# Patient Record
Sex: Female | Born: 1965 | Race: White | Hispanic: No | Marital: Single | Smoking: Current every day smoker
Health system: Southern US, Community
[De-identification: ages and names within clinical notes are randomized; demographics above are authoritative.]

## PROBLEM LIST (undated history)

## (undated) DIAGNOSIS — I10 Essential (primary) hypertension: Secondary | ICD-10-CM

## (undated) DIAGNOSIS — K432 Incisional hernia without obstruction or gangrene: Secondary | ICD-10-CM

## (undated) DIAGNOSIS — K219 Gastro-esophageal reflux disease without esophagitis: Secondary | ICD-10-CM

## (undated) DIAGNOSIS — N3010 Interstitial cystitis (chronic) without hematuria: Secondary | ICD-10-CM

## (undated) DIAGNOSIS — F329 Major depressive disorder, single episode, unspecified: Secondary | ICD-10-CM

## (undated) DIAGNOSIS — E559 Vitamin D deficiency, unspecified: Secondary | ICD-10-CM

## (undated) DIAGNOSIS — E782 Mixed hyperlipidemia: Secondary | ICD-10-CM

## (undated) HISTORY — DX: Incisional hernia without obstruction or gangrene: K43.2

## (undated) HISTORY — PX: VAGINAL HYSTERECTOMY: SHX2639

## (undated) HISTORY — PX: HERNIA REPAIR: SHX51

## (undated) HISTORY — PX: CHOLECYSTECTOMY: SHX55

## (undated) HISTORY — DX: Vitamin D deficiency, unspecified: E55.9

## (undated) HISTORY — DX: Gastro-esophageal reflux disease without esophagitis: K21.9

## (undated) HISTORY — DX: Major depressive disorder, single episode, unspecified: F32.9

## (undated) HISTORY — DX: Interstitial cystitis (chronic) without hematuria: N30.10

## (undated) HISTORY — PX: OTHER SURGICAL HISTORY: SHX169

## (undated) HISTORY — DX: Mixed hyperlipidemia: E78.2

## (undated) HISTORY — PX: TUBAL LIGATION: SHX77

## (undated) HISTORY — DX: Essential (primary) hypertension: I10

---

## 2006-03-18 ENCOUNTER — Ambulatory Visit: Payer: Self-pay | Admitting: General Surgery

## 2006-05-23 ENCOUNTER — Ambulatory Visit: Payer: Self-pay | Admitting: General Surgery

## 2006-08-17 ENCOUNTER — Ambulatory Visit: Payer: Self-pay | Admitting: Gastroenterology

## 2012-04-03 ENCOUNTER — Ambulatory Visit: Payer: Self-pay | Admitting: Family Medicine

## 2012-04-07 ENCOUNTER — Ambulatory Visit: Payer: Self-pay | Admitting: Family Medicine

## 2012-04-20 ENCOUNTER — Ambulatory Visit: Payer: Self-pay | Admitting: Urology

## 2012-04-20 ENCOUNTER — Emergency Department: Payer: Self-pay | Admitting: Emergency Medicine

## 2012-04-21 LAB — CBC
HCT: 41.2 % (ref 35.0–47.0)
HGB: 12.9 g/dL (ref 12.0–16.0)
MCH: 28.2 pg (ref 26.0–34.0)
MCHC: 31.2 g/dL — ABNORMAL LOW (ref 32.0–36.0)
MCV: 91 fL (ref 80–100)
Platelet: 420 10*3/uL (ref 150–440)
RBC: 4.55 10*6/uL (ref 3.80–5.20)
WBC: 19 10*3/uL — ABNORMAL HIGH (ref 3.6–11.0)

## 2012-04-21 LAB — COMPREHENSIVE METABOLIC PANEL
Albumin: 3.7 g/dL (ref 3.4–5.0)
Alkaline Phosphatase: 86 U/L (ref 50–136)
Bilirubin,Total: 0.6 mg/dL (ref 0.2–1.0)
Calcium, Total: 10.5 mg/dL — ABNORMAL HIGH (ref 8.5–10.1)
Co2: 20 mmol/L — ABNORMAL LOW (ref 21–32)
Creatinine: 0.84 mg/dL (ref 0.60–1.30)
EGFR (Non-African Amer.): >60
SGPT (ALT): 35 U/L (ref 12–78)
Sodium: 135 mmol/L — ABNORMAL LOW (ref 136–145)
Total Protein: 8.2 g/dL (ref 6.4–8.2)

## 2012-04-21 LAB — URINALYSIS, COMPLETE
Bilirubin,UR: NEGATIVE
Glucose,UR: NEGATIVE mg/dL (ref 0–75)
Ph: 6 (ref 4.5–8.0)
RBC,UR: 645 /HPF (ref 0–5)
Specific Gravity: 1.021 (ref 1.003–1.030)
Squamous Epithelial: 16

## 2012-10-10 HISTORY — PX: OTHER SURGICAL HISTORY: SHX169

## 2013-08-22 DIAGNOSIS — K432 Incisional hernia without obstruction or gangrene: Secondary | ICD-10-CM

## 2013-08-22 HISTORY — DX: Incisional hernia without obstruction or gangrene: K43.2

## 2013-10-30 DIAGNOSIS — N3010 Interstitial cystitis (chronic) without hematuria: Secondary | ICD-10-CM

## 2013-10-30 HISTORY — DX: Interstitial cystitis (chronic) without hematuria: N30.10

## 2013-11-07 HISTORY — PX: OTHER SURGICAL HISTORY: SHX169

## 2014-07-15 DIAGNOSIS — I10 Essential (primary) hypertension: Secondary | ICD-10-CM

## 2014-07-15 HISTORY — DX: Essential (primary) hypertension: I10

## 2014-08-22 DIAGNOSIS — E782 Mixed hyperlipidemia: Secondary | ICD-10-CM

## 2014-08-22 HISTORY — DX: Mixed hyperlipidemia: E78.2

## 2014-11-07 ENCOUNTER — Other Ambulatory Visit: Payer: Self-pay | Admitting: Family Medicine

## 2014-11-07 DIAGNOSIS — R140 Abdominal distension (gaseous): Secondary | ICD-10-CM

## 2014-11-07 DIAGNOSIS — N838 Other noninflammatory disorders of ovary, fallopian tube and broad ligament: Secondary | ICD-10-CM

## 2014-11-11 ENCOUNTER — Ambulatory Visit: Admission: RE | Admit: 2014-11-11 | Payer: Self-pay | Source: Ambulatory Visit

## 2014-11-11 ENCOUNTER — Ambulatory Visit: Payer: Self-pay

## 2014-11-20 ENCOUNTER — Ambulatory Visit
Admission: RE | Admit: 2014-11-20 | Discharge: 2014-11-20 | Disposition: A | Discharge status: Home / Self Care | Payer: 59 | Source: Ambulatory Visit | Attending: Family Medicine | Admitting: Family Medicine

## 2014-11-20 ENCOUNTER — Ambulatory Visit: Payer: 59

## 2014-11-20 DIAGNOSIS — N838 Other noninflammatory disorders of ovary, fallopian tube and broad ligament: Secondary | ICD-10-CM

## 2014-11-20 DIAGNOSIS — Z87442 Personal history of urinary calculi: Secondary | ICD-10-CM | POA: Diagnosis not present

## 2014-11-20 DIAGNOSIS — R140 Abdominal distension (gaseous): Secondary | ICD-10-CM | POA: Insufficient documentation

## 2014-11-20 DIAGNOSIS — Z9049 Acquired absence of other specified parts of digestive tract: Secondary | ICD-10-CM | POA: Insufficient documentation

## 2014-11-22 ENCOUNTER — Other Ambulatory Visit: Payer: Self-pay | Admitting: Family Medicine

## 2014-11-22 DIAGNOSIS — R932 Abnormal findings on diagnostic imaging of liver and biliary tract: Secondary | ICD-10-CM

## 2014-11-28 ENCOUNTER — Ambulatory Visit
Admission: RE | Admit: 2014-11-28 | Discharge: 2014-11-28 | Disposition: A | Discharge status: Home / Self Care | Payer: 59 | Source: Ambulatory Visit | Attending: Family Medicine | Admitting: Family Medicine

## 2014-11-28 DIAGNOSIS — R932 Abnormal findings on diagnostic imaging of liver and biliary tract: Secondary | ICD-10-CM

## 2014-11-28 DIAGNOSIS — R935 Abnormal findings on diagnostic imaging of other abdominal regions, including retroperitoneum: Secondary | ICD-10-CM | POA: Insufficient documentation

## 2014-11-28 DIAGNOSIS — K760 Fatty (change of) liver, not elsewhere classified: Secondary | ICD-10-CM | POA: Diagnosis not present

## 2014-11-28 DIAGNOSIS — R109 Unspecified abdominal pain: Secondary | ICD-10-CM | POA: Diagnosis present

## 2014-11-28 DIAGNOSIS — Z09 Encounter for follow-up examination after completed treatment for conditions other than malignant neoplasm: Secondary | ICD-10-CM | POA: Diagnosis not present

## 2014-11-28 MED ORDER — GADOBENATE DIMEGLUMINE 529 MG/ML IV SOLN
20.0000 mL | Freq: Once | INTRAVENOUS | Status: AC | PRN
  Administered 2014-11-28: 18 mL via INTRAVENOUS

## 2014-12-11 ENCOUNTER — Other Ambulatory Visit: Payer: Self-pay

## 2014-12-11 DIAGNOSIS — E559 Vitamin D deficiency, unspecified: Secondary | ICD-10-CM

## 2014-12-11 DIAGNOSIS — R609 Edema, unspecified: Secondary | ICD-10-CM | POA: Insufficient documentation

## 2014-12-11 DIAGNOSIS — F329 Major depressive disorder, single episode, unspecified: Secondary | ICD-10-CM | POA: Insufficient documentation

## 2014-12-11 DIAGNOSIS — F32A Depression, unspecified: Secondary | ICD-10-CM

## 2014-12-11 DIAGNOSIS — K219 Gastro-esophageal reflux disease without esophagitis: Secondary | ICD-10-CM

## 2014-12-11 HISTORY — DX: Depression, unspecified: F32.A

## 2014-12-11 HISTORY — DX: Gastro-esophageal reflux disease without esophagitis: K21.9

## 2014-12-11 HISTORY — DX: Vitamin D deficiency, unspecified: E55.9

## 2014-12-12 ENCOUNTER — Encounter: Payer: Self-pay | Admitting: Gastroenterology

## 2014-12-12 ENCOUNTER — Ambulatory Visit (INDEPENDENT_AMBULATORY_CARE_PROVIDER_SITE_OTHER): Payer: 59 | Admitting: Gastroenterology

## 2014-12-12 ENCOUNTER — Encounter (INDEPENDENT_AMBULATORY_CARE_PROVIDER_SITE_OTHER): Payer: Self-pay

## 2014-12-12 VITALS — BP 114/64 | HR 75 | Temp 98.2°F | Ht 67.0 in | Wt 192.0 lb

## 2014-12-12 DIAGNOSIS — K760 Fatty (change of) liver, not elsewhere classified: Secondary | ICD-10-CM | POA: Diagnosis not present

## 2014-12-12 NOTE — Progress Notes (Signed)
Gastroenterology Consultation  Referring Provider:     Sula Rumple, MD Primary Care Physician:  Sula Rumple, MD Primary Gastroenterologist:  Dr. Servando Snare     Reason for Consultation:     Abdominal pain and fatty liver        HPI:   Kelly Middleton is a 49 y.o. y/o female referred for consultation & management of abdominal pain and fatty liver by Dr. Sula Rumple, MD.  This patient comes today with a report of being told she had fatty liver. The patient had an ultrasound that showed diffuse changes throughout the liver with 1 spot that looked different. The patient was then set up for an MRI and that spot appeared to be focal fatty sparing. The patient also reports that she has a lot of bloating and has had multiple surgeries on her abdomen for hernia repair. The patient states that her bloating and abdominal distention started after she had the surgeries. She also reports that she has been gaining weight. There is no report of any black stools or bloody stools. She also denies any fevers or chills. There is no report of any alcohol abuse. She also denies any diabetes but states that she has been told that her cholesterol has been high in the past. The patient's abdominal discomfort is mostly on the right side in the mid abdomen. The patient states that she also has heartburn which is not being completely helped by her PPI and she states that she has frequent acid breakthrough daily.  Past Medical History  Diagnosis Date  . Essential (primary) hypertension 07/15/2014  . Acid reflux 12/11/2014  . Chronic interstitial cystitis 10/30/2013  . Clinical depression 12/11/2014  . 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 Recurrent ventral hernia 08/22/2013  . Avitaminosis D 12/11/2014    Past Surgical History  Procedure Laterality Date  . Ventral hernia repair with mesh      01/2010 & 02/20/2014  . Tubal ligation    . Lens eye surgery  10/2012  . Hernia repair    .  Cholecystectomy    . Cataract surgery  11/07/2013  . Vaginal hysterectomy      with bladder tack    Prior to Admission medications   Medication Sig Start Date End Date Taking? Authorizing Provider  chlorthalidone (HYGROTON) 25 MG tablet Take by mouth. 08/22/14 08/22/15 Yes Historical Provider, MD  citalopram (CELEXA) 40 MG tablet Take by mouth. 08/22/14  Yes Historical Provider, MD  lovastatin (MEVACOR) 20 MG tablet TAKE 1 TABLET (20 MG TOTAL) BY MOUTH DAILY WITH DINNER. 11/12/14  Yes Historical Provider, MD  omeprazole (PRILOSEC) 40 MG capsule Take by mouth. 08/22/14  Yes Historical Provider, MD  guaiFENesin-codeine (ROBITUSSIN AC) 100-10 MG/5ML syrup Take by mouth. 03/29/14   Historical Provider, MD  tamsulosin (FLOMAX) 0.4 MG CAPS capsule Take by mouth. 11/06/14 11/06/15  Historical Provider, MD    Family History  Problem Relation Age of Onset  . Breast cancer Mother      Social History  Substance Use Topics  . Smoking status: Current Every Day Smoker  . Smokeless tobacco: Never Used  . Alcohol Use: No    Allergies as of 12/12/2014  . (No Known Allergies)    Review of Systems:    All systems reviewed and negative except where noted in HPI.   Physical Exam:  BP 114/64 mmHg  Pulse 75  Temp(Src) 98.2 F (36.8 C) (Oral)  Ht 5\' 7"  (1.702 m)  Wt  192 lb (87.091 kg)  BMI 30.06 kg/m2 No LMP recorded. Patient has had a hysterectomy. Psych:  Alert and cooperative. Normal mood and affect. General:   Alert,  Well-developed, well-nourished, pleasant and cooperative in NAD Head:  Normocephalic and atraumatic. Eyes:  Sclera clear, no icterus.   Conjunctiva pink. Ears:  Normal auditory acuity. Nose:  No deformity, discharge, or lesions. Mouth:  No deformity or lesions,oropharynx pink & moist. Neck:  Supple; no masses or thyromegaly. Lungs:  Respirations even and unlabored.  Clear throughout to auscultation.   No wheezes, crackles, or rhonchi. No acute distress. Heart:  Regular rate and  rhythm; no murmurs, clicks, rubs, or gallops. Abdomen:  Normal bowel sounds.  No bruits.  Soft, non-tender and non-distended without masses, hepatosplenomegaly or hernias noted.  No guarding or rebound tenderness.  Positive Carnett sign.   Rectal:  Deferred.  Msk:  Symmetrical without gross deformities.  Good, equal movement & strength bilaterally. Pulses:  Normal pulses noted. Extremities:  No clubbing or edema.  No cyanosis. Neurologic:  Alert and oriented x3;  grossly normal neurologically. Skin:  Intact without significant lesions or rashes.  No jaundice. Lymph Nodes:  No significant cervical adenopathy. Psych:  Alert and cooperative. Normal mood and affect.  Imaging Studies: Mr Abdomen W Wo Contrast  11/28/2014   CLINICAL DATA:  Chronic right-sided abdominal pain and bloating. Indeterminate liver lesion seen on recent ultrasound.  EXAM: MRI ABDOMEN WITHOUT AND WITH CONTRAST  TECHNIQUE: Multiplanar multisequence MR imaging of the abdomen was performed both before and after the administration of intravenous contrast.  CONTRAST:  18mL MULTIHANCE GADOBENATE DIMEGLUMINE 529 MG/ML IV SOLN  COMPARISON:  Ultrasound on 11/20/2014  FINDINGS: Lower chest: No acute findings.  Hepatobiliary: No hypervascular or hypovascular masses identified. A geographic pattern of fatty infiltration is seen mainly involving the right hepatic lobe, with relative sparing of the left lobe. This corresponds with the hepatic abnormality seen on recent ultrasound.  Prior cholecystectomy noted.  No evidence of biliary dilatation.  Pancreas: No masses, inflammatory changes, or fluid collections demonstrated.  Spleen:  Within normal limits in size and appearance.  Adrenal Glands:  No masses identified.  Kidneys: No renal masses identified. No evidence of hydronephrosis.  Stomach/Bowel/Peritoneum: No evidence of wall thickening, mass, or obstruction involving visualized abdominal bowel.  Vascular/Lymphatic: No pathologically enlarged  lymph nodes identified. No other significant abnormality noted.  Other:  None.  Musculoskeletal:  No suspicious bone lesions identified.  IMPRESSION: Geographic pattern of fatty infiltration of the liver, with relative sparing of the left hepatic lobe. This corresponds with the liver abnormality seen on recent ultrasound.  No evidence of hepatic neoplasm or other significant abnormality.   Electronically Signed   By: Myles Rosenthal M.D.   On: 11/28/2014 14:20   US Abdomen Complete  11/20/2014   CLINICAL DATA:  Chronic abdominal bloating ; history of cholecystectomy, history of kidney stones  EXAM: ULTRASOUND ABDOMEN COMPLETE  COMPARISON:  None.  FINDINGS: Gallbladder: The gallbladder is surgically absent.  Common bile duct: Diameter: 3.8 mm  Liver: The hepatic echotexture is mildly increased diffusely. There is an ill defined area of decreased echogenicity adjacent to the gallbladder fossa measuring 3 x 2.6 x 6.7 cm. There is no it intrahepatic ductal dilation.  IVC: No abnormality visualized.  Pancreas: The pancreatic body and tail are unremarkable. The pancreatic head was partially obscured by bowel gas.  Spleen: Size and appearance within normal limits.  Right Kidney: Length: 10.7 cm. Echogenicity within normal limits. No mass  or hydronephrosis visualized.  Left Kidney: Length: 10.6 cm. Echogenicity within normal limits. No mass or hydronephrosis visualized.  Abdominal aorta: Portions of the abdominal aorta were obscured by bowel gas.  Other findings: No ascites is demonstrated.  IMPRESSION: 1. Ill-defined area of decreased echogenicity adjacent to the porta hepatis may reflect focal fatty sparing but it is nonspecific. Further evaluation with hepatic protocol MRI is recommended. 2. The gallbladder is surgically absent. The observed portions of the pancreas are normal but the pancreatic head was incompletely evaluated due to bowel gas.   Electronically Signed   By: David  [place] M.D.   On: 11/20/2014 11:21   US  Transvaginal Non-ob  11/20/2014   CLINICAL DATA:  Bloating. History kidney stones. History of cholecystectomy. History of hysterectomy.  EXAM: TRANSABDOMINAL AND TRANSVAGINAL ULTRASOUND OF PELVIS  TECHNIQUE: Both transabdominal and transvaginal ultrasound examinations of the pelvis were performed. Transabdominal technique was performed for global imaging of the pelvis including uterus, ovaries, adnexal regions, and pelvic cul-de-sac. It was necessary to proceed with endovaginal exam following the transabdominal exam to visualize the ovaries and adnexa.  COMPARISON:  None  FINDINGS: Uterus  Prior hysterectomy.  Right ovary  Measurements: 1.9 x 3.0 x 2.3 cm. 1.2 cm simple right ovarian cyst.  Left ovary  Not visualized.  Other findings  No free fluid.  IMPRESSION: 1. Prior hysterectomy.  Left ovary not visualized.  2.  1.2 cm simple right ovarian cyst.  This is almost certainly benign, and no specific imaging follow up is recommended according to the Society of Radiologists in Ultrasound2010 Consensus Conference Statement (D Lenis Noon et al. Management of Asymptomatic Ovarian and Other Adnexal Cysts Imaged at [place]: Society of Radiologists in Ultrasound Consensus Conference Statement 2010. Radiology 256 (Sept 2010): 943-954.).   Electronically Signed   By: Maisie Fus  Register   On: 11/20/2014 11:58   US Pelvis Complete  11/20/2014   CLINICAL DATA:  Bloating. History kidney stones. History of cholecystectomy. History of hysterectomy.  EXAM: TRANSABDOMINAL AND TRANSVAGINAL ULTRASOUND OF PELVIS  TECHNIQUE: Both transabdominal and transvaginal ultrasound examinations of the pelvis were performed. Transabdominal technique was performed for global imaging of the pelvis including uterus, ovaries, adnexal regions, and pelvic cul-de-sac. It was necessary to proceed with endovaginal exam following the transabdominal exam to visualize the ovaries and adnexa.  COMPARISON:  None  FINDINGS: Uterus  Prior hysterectomy.  Right ovary   Measurements: 1.9 x 3.0 x 2.3 cm. 1.2 cm simple right ovarian cyst.  Left ovary  Not visualized.  Other findings  No free fluid.  IMPRESSION: 1. Prior hysterectomy.  Left ovary not visualized.  2.  1.2 cm simple right ovarian cyst.  This is almost certainly benign, and no specific imaging follow up is recommended according to the Society of Radiologists in Ultrasound2010 Consensus Conference Statement (D Lenis Noon et al. Management of Asymptomatic Ovarian and Other Adnexal Cysts Imaged at [place]: Society of Radiologists in Ultrasound Consensus Conference Statement 2010. Radiology 256 (Sept 2010): 943-954.).   Electronically Signed   By: Maisie Fus  Register   On: 11/20/2014 11:58    Assessment and Plan:   Kelly Middleton is a 50 y.o. y/o female who comes today with a history of an ultrasound and MRI showed fatty liver. The patient has been told to try to lose weight to decrease the amount of fat in her liver. She states that multiple friend of hers have had liver biopsies when they have been found to have fatty liver and I have told her  that this is not needed at this time. The patient also appears to not have had liver function test done in some time. She will be set up for a liver panel. Her abdominal pain on physical exam was consistent with musculoskeletal pain and she has been explained this. The patient also suffers from heartburn that she states has not helped with her present medication and she will be started on a trial of Dexilant. The patient does not need a upper endoscopy at this time. He should has been explained the plan and agrees with it.   Note: This dictation was prepared with Dragon dictation along with smaller phrase technology. Any transcriptional errors that result from this process are unintentional.

## 2014-12-18 ENCOUNTER — Telehealth: Payer: Self-pay

## 2014-12-18 DIAGNOSIS — K219 Gastro-esophageal reflux disease without esophagitis: Secondary | ICD-10-CM

## 2014-12-18 MED ORDER — DEXLANSOPRAZOLE 60 MG PO CPDR: 60.0000 mg | DELAYED_RELEASE_CAPSULE | Freq: Every day | ORAL | Status: DC

## 2014-12-18 NOTE — Telephone Encounter (Signed)
-----   Message from Armandina Gemma sent at 12/17/2014  9:55 AM EDT ----- Patient would like a script for dexilant  called into her pharmacy [facility] in [place]. She was given samples at her office visit and she said it works great.

## 2015-10-24 ENCOUNTER — Other Ambulatory Visit: Payer: Self-pay | Admitting: Family Medicine

## 2015-10-24 DIAGNOSIS — Z1231 Encounter for screening mammogram for malignant neoplasm of breast: Secondary | ICD-10-CM

## 2015-11-17 ENCOUNTER — Other Ambulatory Visit: Payer: Self-pay | Admitting: Family Medicine

## 2015-11-17 ENCOUNTER — Ambulatory Visit
Admission: RE | Admit: 2015-11-17 | Discharge: 2015-11-17 | Disposition: A | Discharge status: Home / Self Care | Payer: 59 | Source: Ambulatory Visit | Attending: Family Medicine | Admitting: Family Medicine

## 2015-11-17 DIAGNOSIS — Z1231 Encounter for screening mammogram for malignant neoplasm of breast: Secondary | ICD-10-CM

## 2015-12-20 ENCOUNTER — Other Ambulatory Visit: Payer: Self-pay | Admitting: Gastroenterology

## 2015-12-20 DIAGNOSIS — K219 Gastro-esophageal reflux disease without esophagitis: Secondary | ICD-10-CM

## 2017-01-13 ENCOUNTER — Other Ambulatory Visit: Payer: Self-pay | Admitting: Gastroenterology

## 2017-01-13 DIAGNOSIS — K219 Gastro-esophageal reflux disease without esophagitis: Secondary | ICD-10-CM

## 2017-01-18 ENCOUNTER — Other Ambulatory Visit: Payer: Self-pay

## 2017-01-18 DIAGNOSIS — K219 Gastro-esophageal reflux disease without esophagitis: Secondary | ICD-10-CM

## 2017-01-18 MED ORDER — DEXLANSOPRAZOLE 60 MG PO CPDR: 60.0000 mg | DELAYED_RELEASE_CAPSULE | Freq: Every day | ORAL | 11 refills | Status: AC

## 2017-03-15 IMAGING — US US ABDOMEN COMPLETE
1 series · 13 of 25 positions shown · non-contrast
Comparison: None.

CLINICAL DATA: Chronic abdominal bloating ; history of
cholecystectomy, history of kidney stones

EXAM:
ULTRASOUND ABDOMEN COMPLETE

[Series 1: us abdomen complete · 0.25mm/px · 13 of 96 slices shown]
[im 1/96]
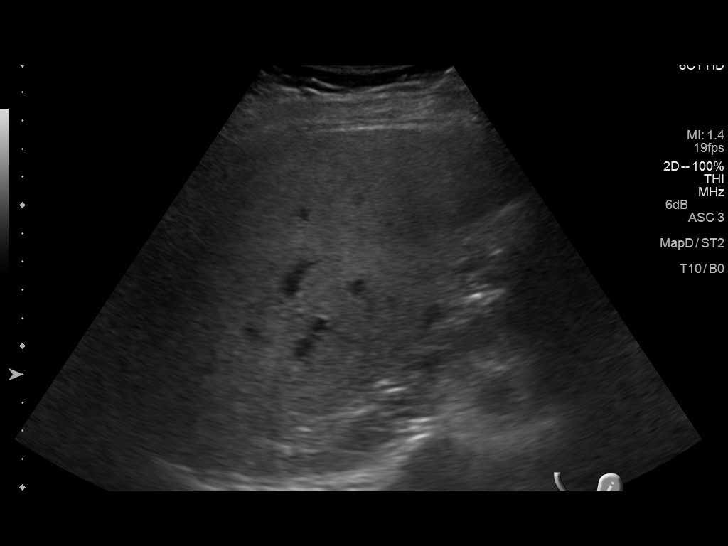
[im 8/96]
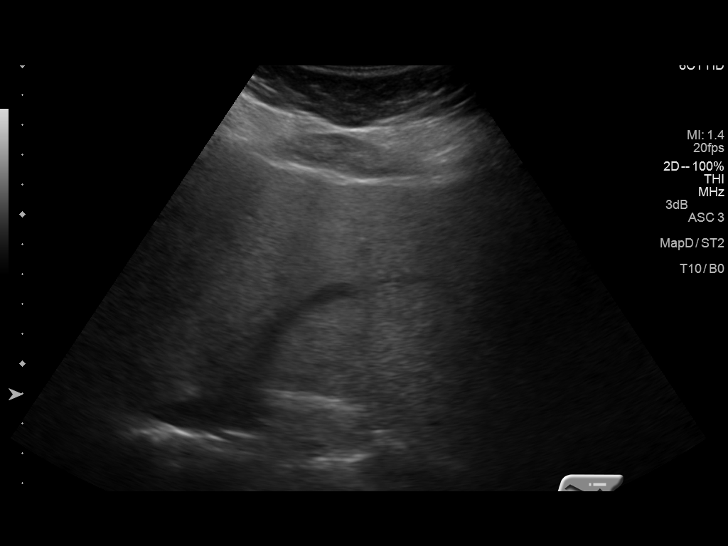
[im 16/96]
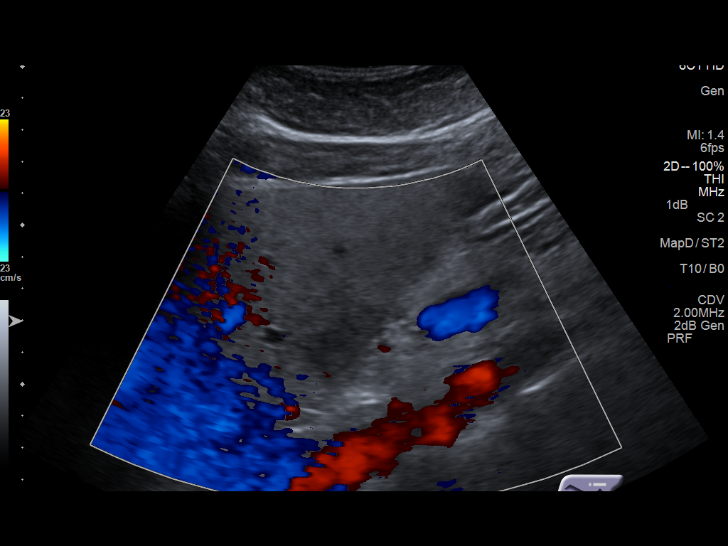
[im 24/96]
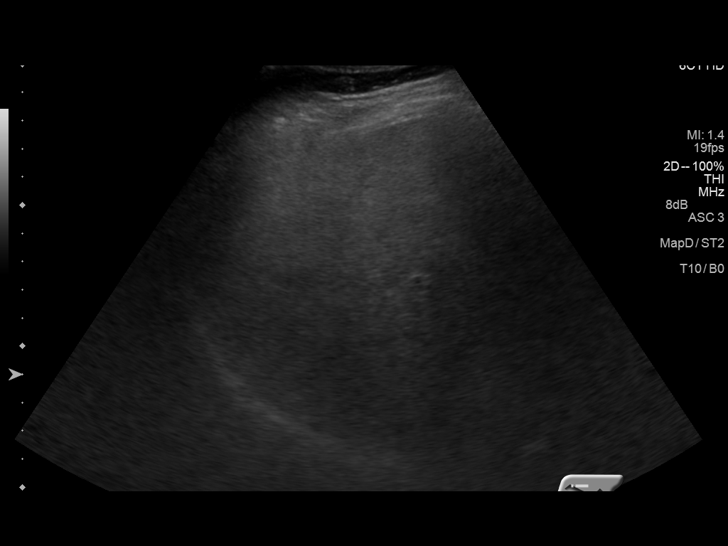
[im 32/96]
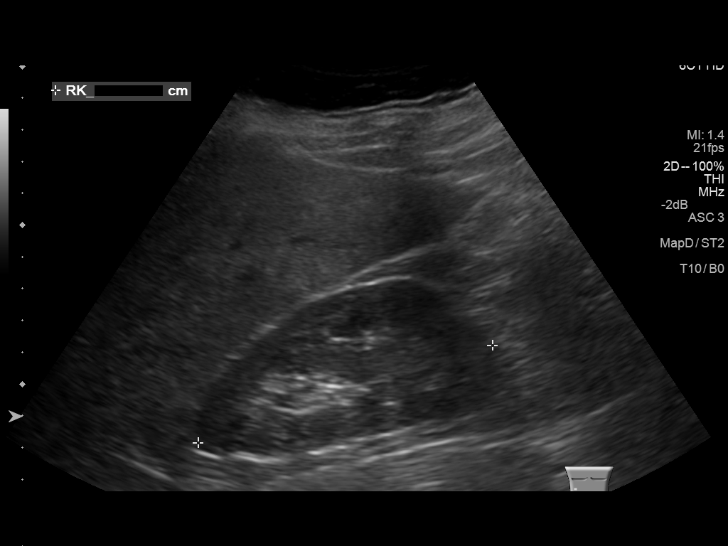
[im 40/96]
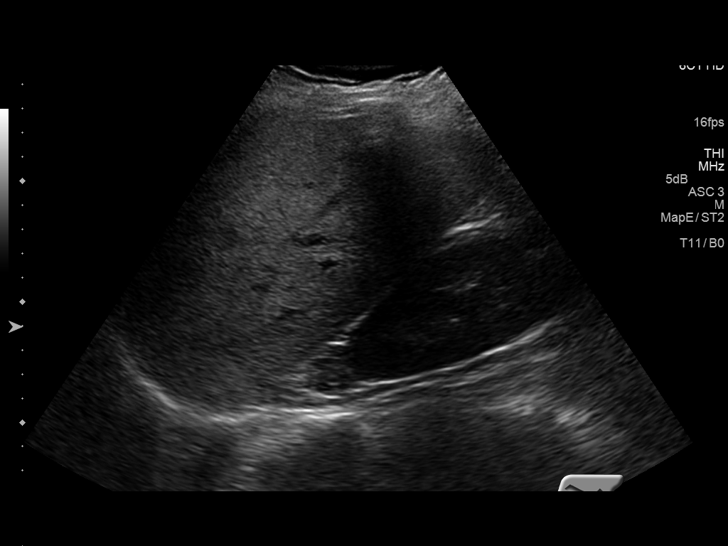
[im 48/96]
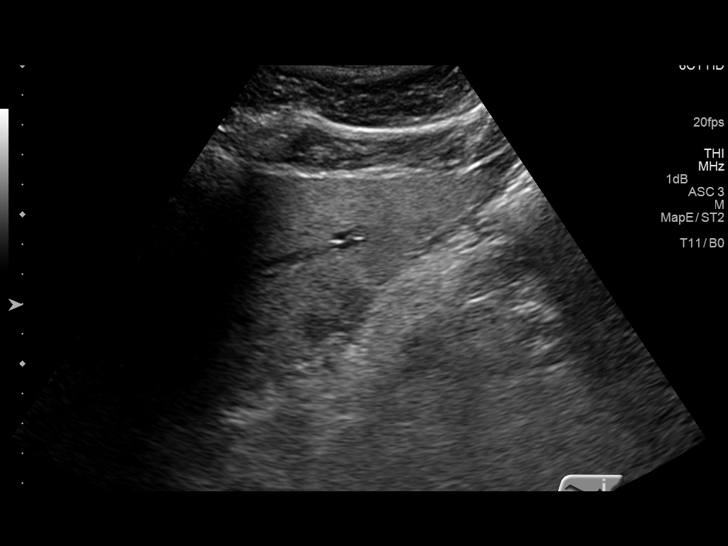
[im 56/96]
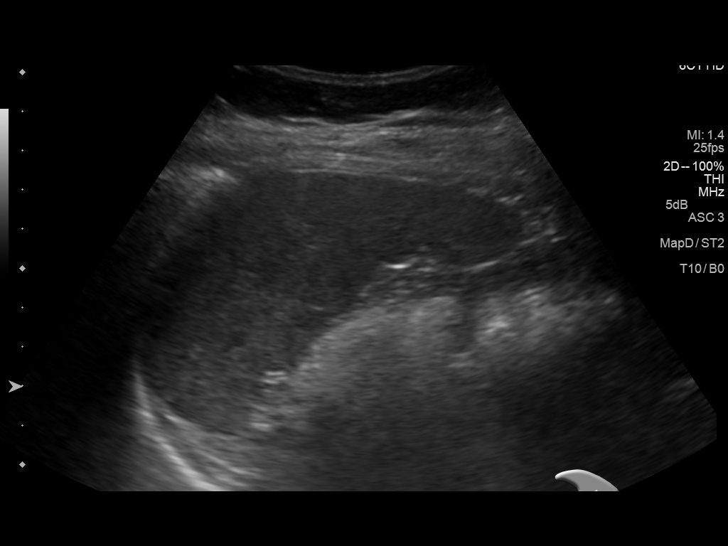
[im 64/96]
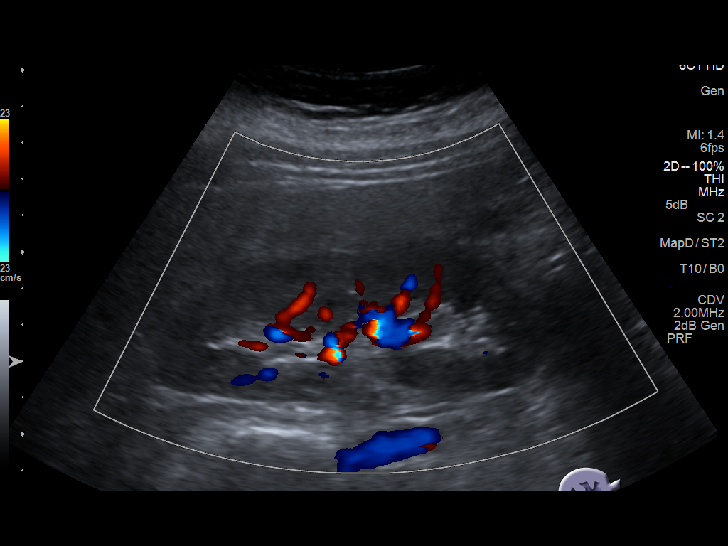
[im 72/96]
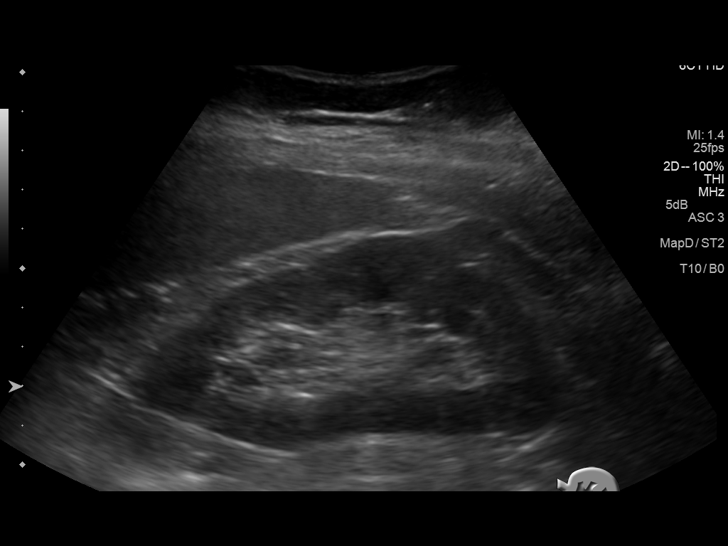
[im 80/96]
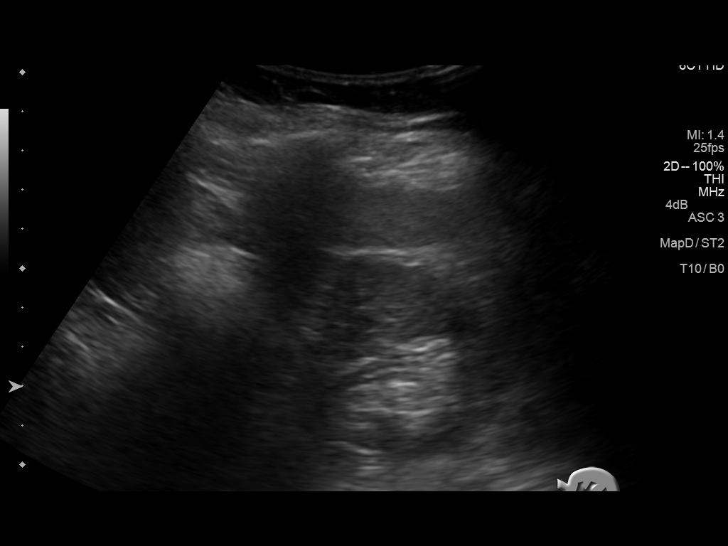
[im 88/96]
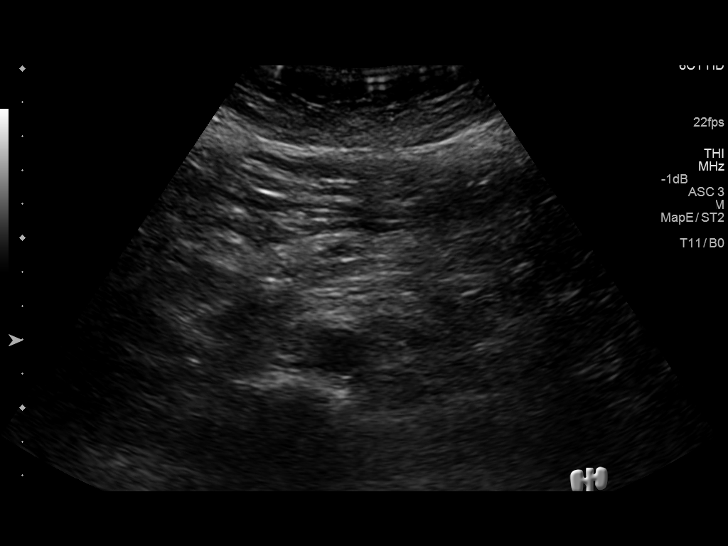
[im 96/96]
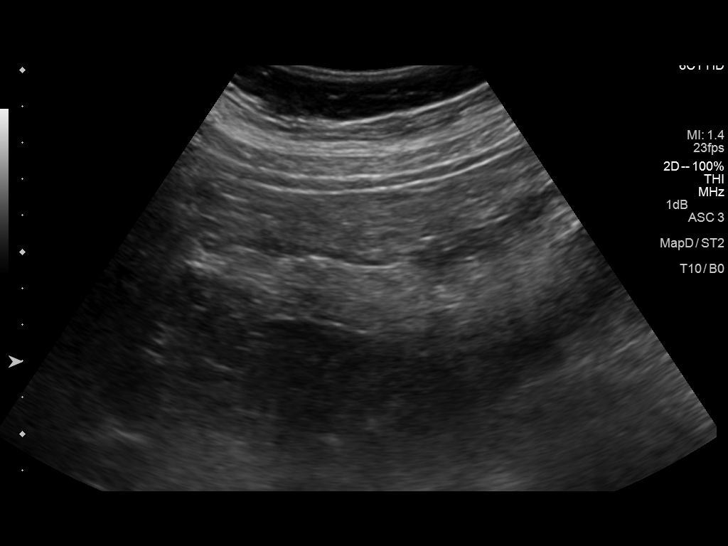

[13 of 25 positions shown; findings below may reference images not displayed]

FINDINGS: Gallbladder: The gallbladder is surgically absent.

Common bile duct: Diameter: 3.8 mm

Liver: The hepatic echotexture is mildly increased diffusely. There
is an ill defined area of decreased echogenicity adjacent to the
gallbladder fossa measuring 3 x 2.6 x 6.7 cm. There is no it
intrahepatic ductal dilation.

IVC: No abnormality visualized.

Pancreas: The pancreatic body and tail are unremarkable. The
pancreatic head was partially obscured by bowel gas.

Spleen: Size and appearance within normal limits.

Right Kidney: Length: 10.7 cm. Echogenicity within normal limits. No
mass or hydronephrosis visualized.

Left Kidney: Length: 10.6 cm. Echogenicity within normal limits. No
mass or hydronephrosis visualized.

Abdominal aorta: Portions of the abdominal aorta were obscured by
bowel gas.

Other findings: No ascites is demonstrated.
IMPRESSION: 1. Ill-defined area of decreased echogenicity adjacent to the porta
hepatis may reflect focal fatty sparing but it is nonspecific.
Further evaluation with hepatic protocol MRI is recommended.
2. The gallbladder is surgically absent. The observed portions of
the pancreas are normal but the pancreatic head was incompletely
evaluated due to bowel gas.

## 2017-05-17 ENCOUNTER — Other Ambulatory Visit: Payer: Self-pay | Admitting: Nurse Practitioner

## 2017-05-17 DIAGNOSIS — Z1231 Encounter for screening mammogram for malignant neoplasm of breast: Secondary | ICD-10-CM
